# Patient Record
Sex: Female | Born: 1980 | Race: Black or African American | Hispanic: No | Marital: Single | Smoking: Current every day smoker
Health system: Southern US, Community
[De-identification: ages and names within clinical notes are randomized; demographics above are authoritative.]

## PROBLEM LIST (undated history)

## (undated) DIAGNOSIS — O2231 Deep phlebothrombosis in pregnancy, first trimester: Secondary | ICD-10-CM

## (undated) DIAGNOSIS — I2699 Other pulmonary embolism without acute cor pulmonale: Secondary | ICD-10-CM

## (undated) HISTORY — DX: Other pulmonary embolism without acute cor pulmonale: I26.99

## (undated) HISTORY — DX: Deep phlebothrombosis in pregnancy, first trimester: O22.31

---
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
## 2004-02-22 ENCOUNTER — Emergency Department (HOSPITAL_COMMUNITY): Admission: EM | Admit: 2004-02-22 | Discharge: 2004-02-22 | Payer: Self-pay | Admitting: Emergency Medicine

## 2005-01-12 ENCOUNTER — Emergency Department (HOSPITAL_COMMUNITY): Admission: EM | Admit: 2005-01-12 | Discharge: 2005-01-12 | Payer: Self-pay | Admitting: Emergency Medicine

## 2005-01-17 ENCOUNTER — Ambulatory Visit: Payer: Self-pay | Admitting: Internal Medicine

## 2005-01-30 ENCOUNTER — Emergency Department (HOSPITAL_COMMUNITY): Admission: EM | Admit: 2005-01-30 | Discharge: 2005-01-30 | Payer: Self-pay | Admitting: Emergency Medicine

## 2005-01-31 ENCOUNTER — Ambulatory Visit: Payer: Self-pay | Admitting: Family Medicine

## 2005-02-08 ENCOUNTER — Ambulatory Visit: Payer: Self-pay | Admitting: Internal Medicine

## 2005-03-31 ENCOUNTER — Encounter: Admission: RE | Admit: 2005-03-31 | Discharge: 2005-06-29 | Payer: Self-pay | Admitting: Specialist

## 2005-07-25 ENCOUNTER — Emergency Department (HOSPITAL_COMMUNITY): Admission: EM | Admit: 2005-07-25 | Discharge: 2005-07-25 | Payer: Self-pay | Admitting: Family Medicine

## 2006-12-07 IMAGING — CR DG LUMBAR SPINE COMPLETE 4+V
5 series · 5 of 5 positions shown · non-contrast
Comparison: none

CLINICAL DATA: Fell and has mid to upper lumbar pain. 
 LUMBAR SPINE - 5 VIEW:

[t l-spine a.p.]
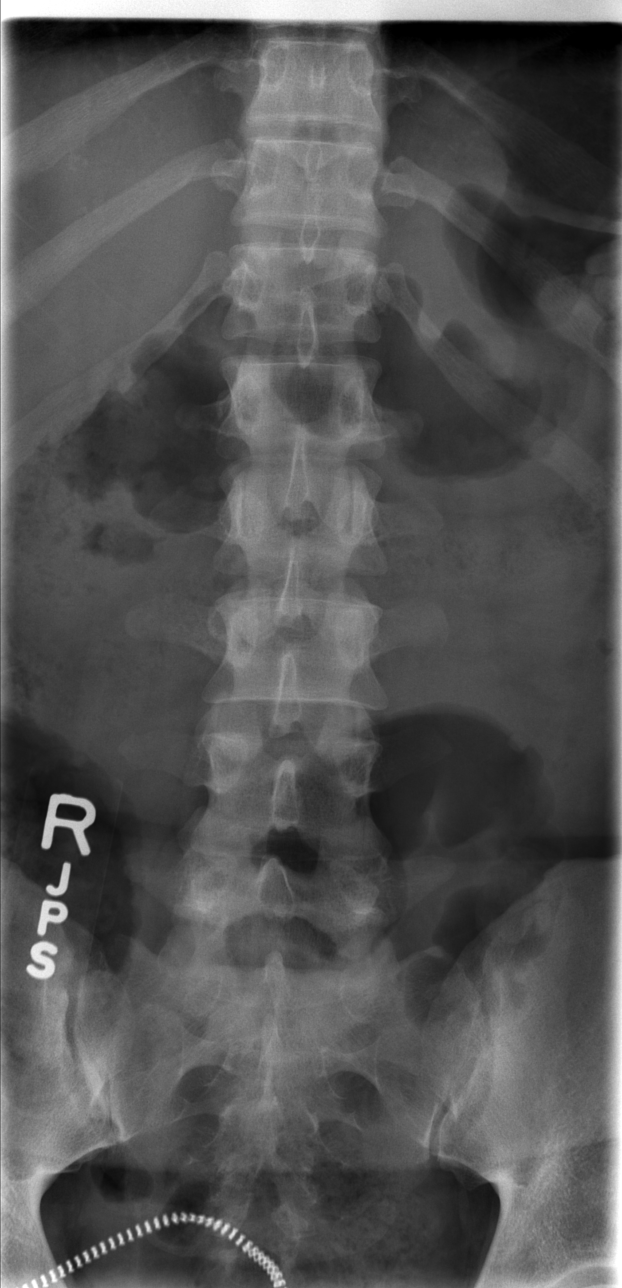

[t l-spine oblique exposure (1 of 2)]
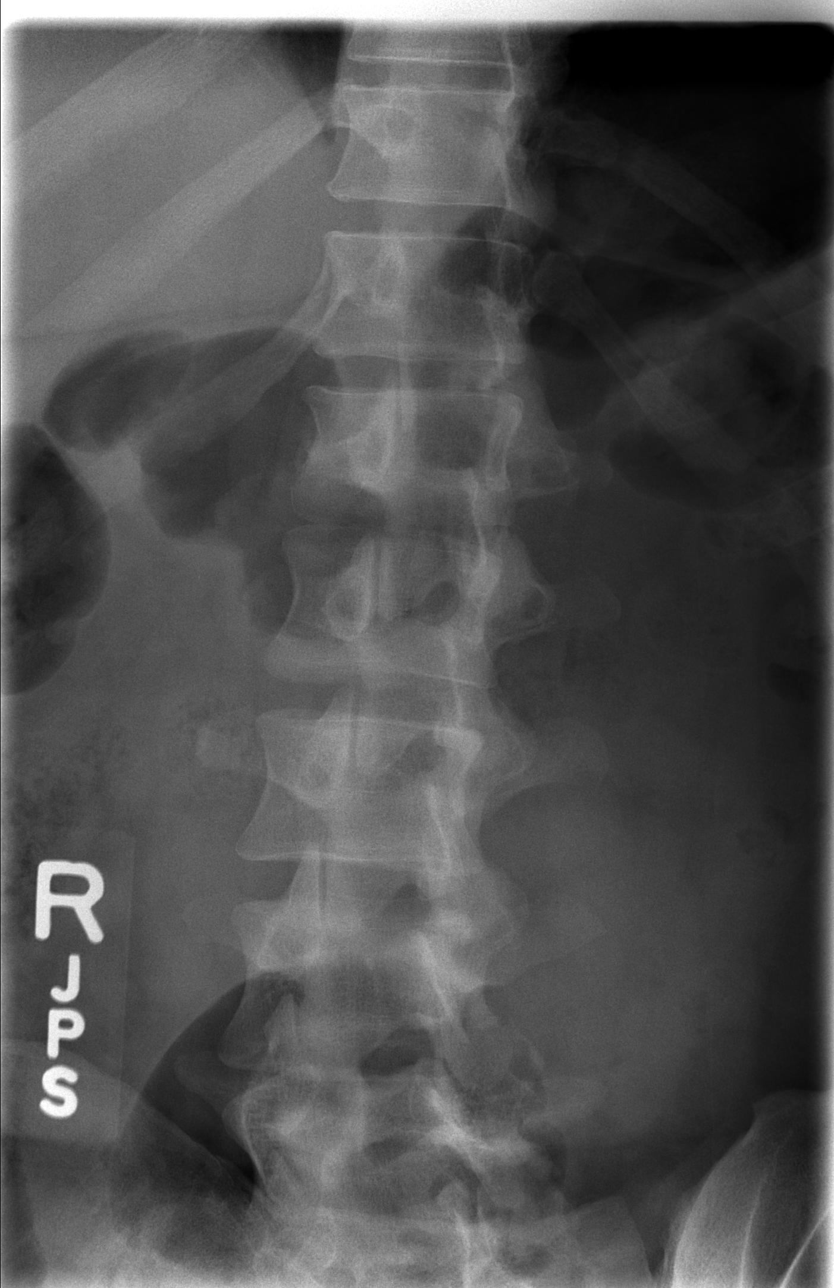

[t l-spine oblique exposure (2 of 2)]
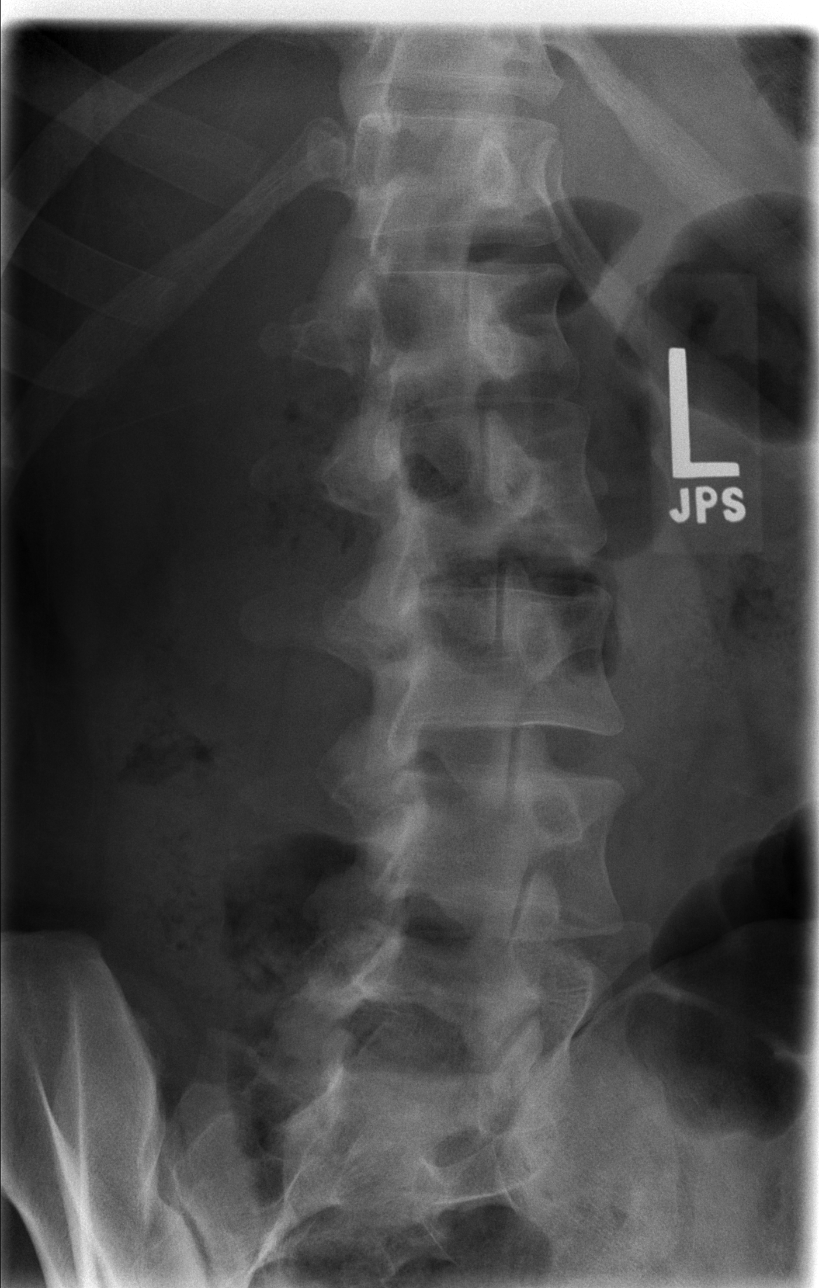

[t l-spine lat]
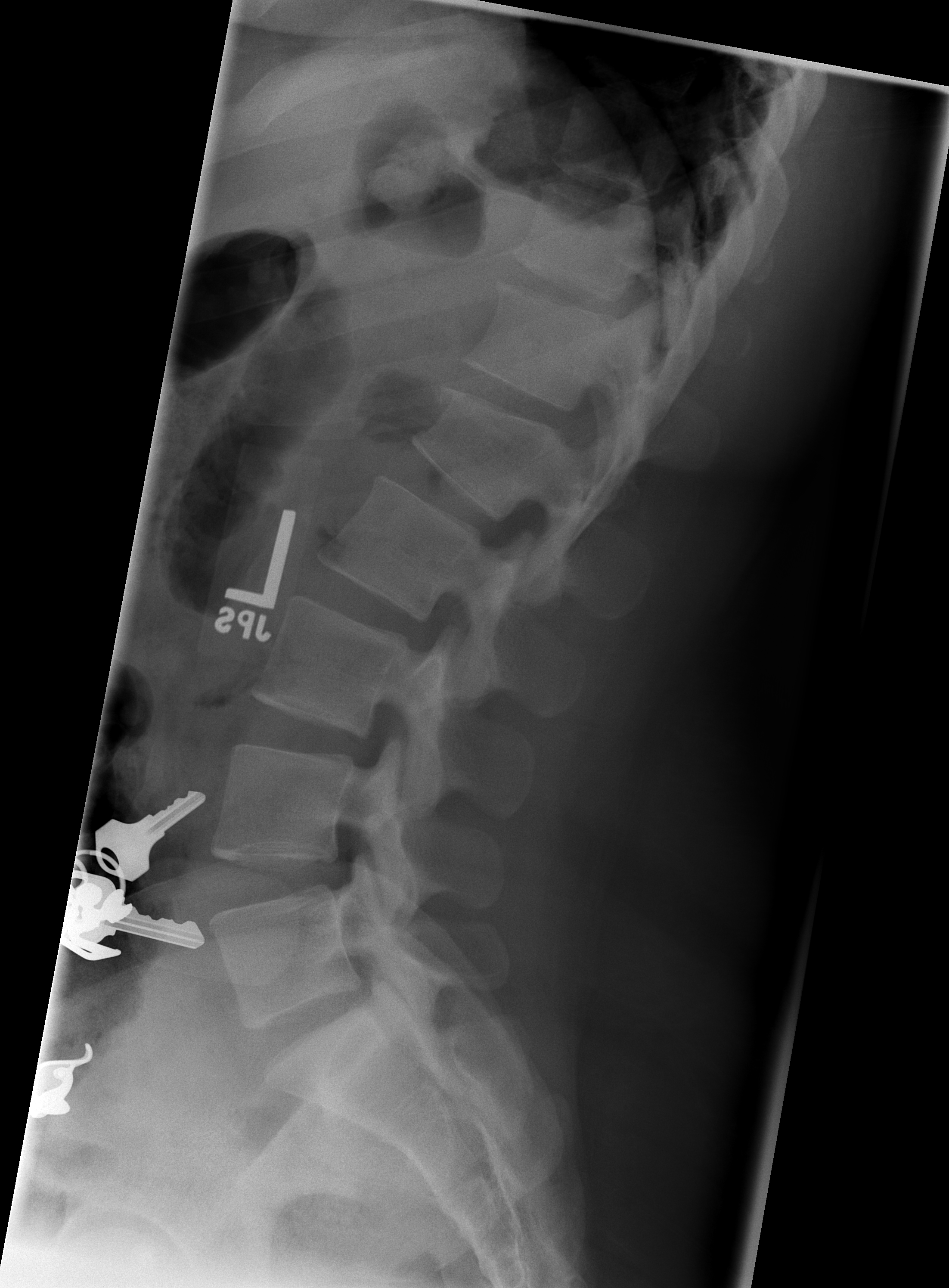

[t l-spine l5-s1 spot]
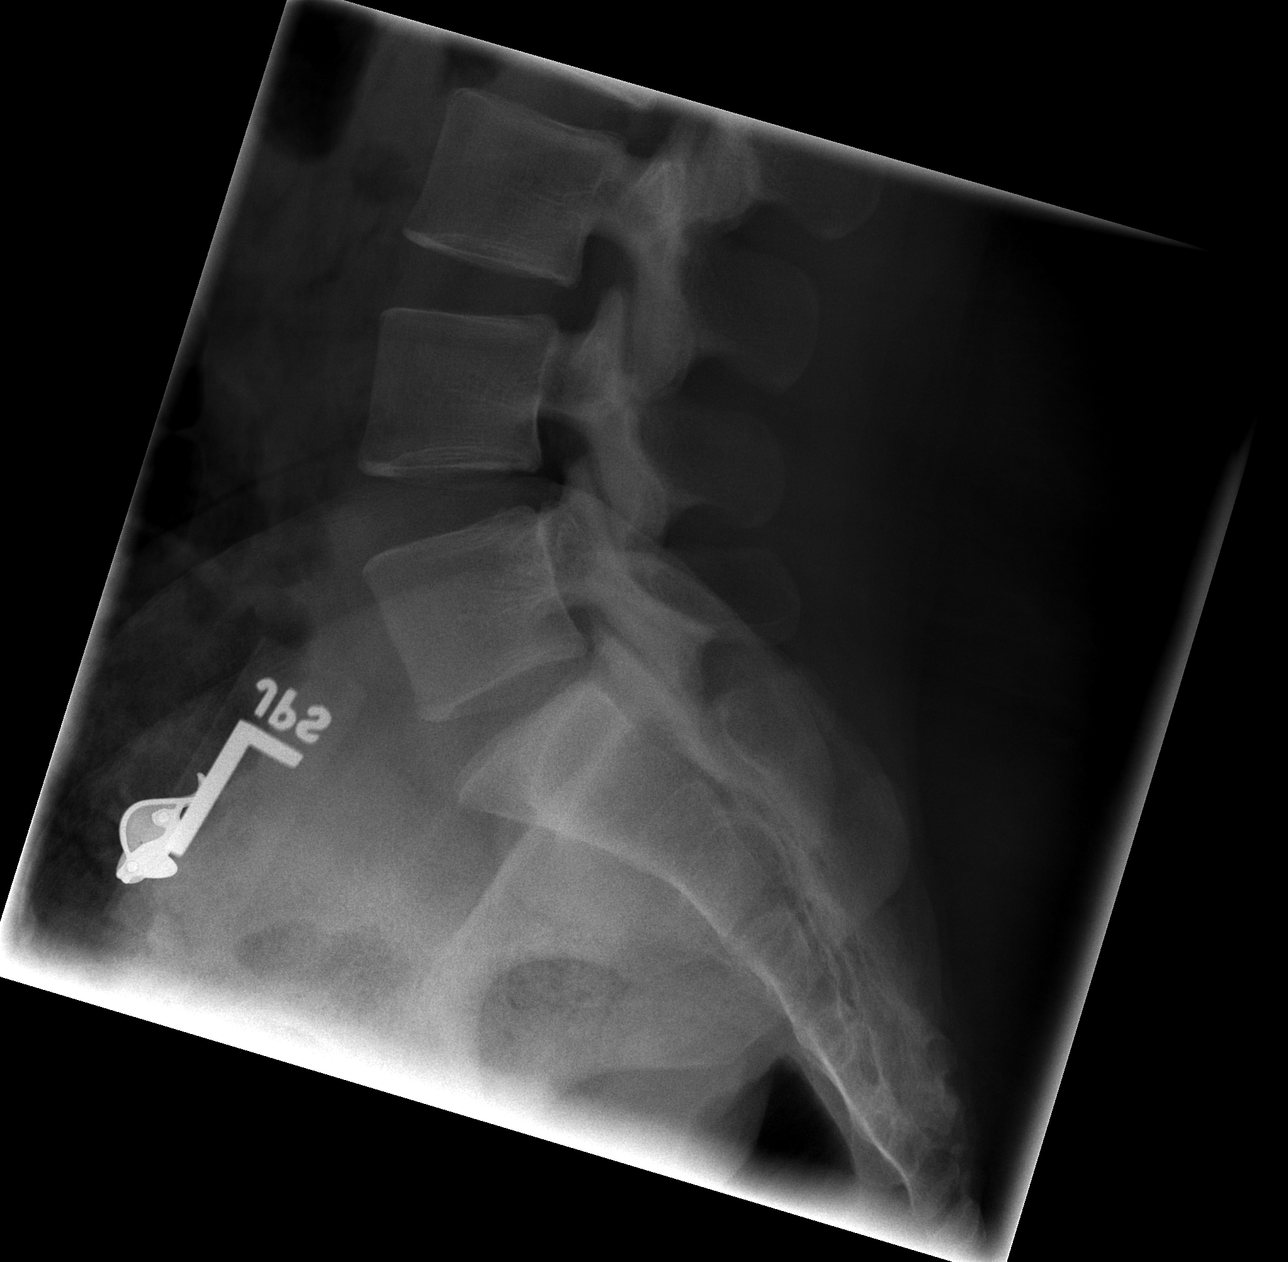

[5 of 5 positions shown; findings below may reference images not displayed]

FINDINGS: There is an acute fracture of the L1 vertebral body with anterior wedging.  Fracture appears to mainly involve the anterosuperior aspect.  Loss of height of the anterior aspect of L1 body by approximately 20%.
IMPRESSION: Acute L1 compression fracture with anterior wedging.

## 2017-08-28 ENCOUNTER — Inpatient Hospital Stay (HOSPITAL_COMMUNITY): Payer: Medicaid Other | Attending: Hematology | Admitting: Hematology

## 2017-08-28 ENCOUNTER — Encounter (HOSPITAL_COMMUNITY): Payer: Self-pay | Admitting: Hematology

## 2017-08-28 ENCOUNTER — Other Ambulatory Visit: Payer: Self-pay

## 2017-08-28 VITALS — BP 108/68 | HR 105 | Temp 97.8°F | Resp 16 | Wt 244.5 lb

## 2017-08-28 DIAGNOSIS — D6859 Other primary thrombophilia: Secondary | ICD-10-CM | POA: Insufficient documentation

## 2017-08-28 DIAGNOSIS — Z3A28 28 weeks gestation of pregnancy: Secondary | ICD-10-CM

## 2017-08-28 DIAGNOSIS — Z7901 Long term (current) use of anticoagulants: Secondary | ICD-10-CM | POA: Insufficient documentation

## 2017-08-28 DIAGNOSIS — Z86711 Personal history of pulmonary embolism: Secondary | ICD-10-CM

## 2017-08-28 DIAGNOSIS — Z800 Family history of malignant neoplasm of digestive organs: Secondary | ICD-10-CM | POA: Insufficient documentation

## 2017-08-28 DIAGNOSIS — O99333 Smoking (tobacco) complicating pregnancy, third trimester: Secondary | ICD-10-CM | POA: Diagnosis not present

## 2017-08-28 DIAGNOSIS — O88313 Pyemic and septic embolism in pregnancy, third trimester: Secondary | ICD-10-CM

## 2017-08-28 DIAGNOSIS — O2223 Superficial thrombophlebitis in pregnancy, third trimester: Secondary | ICD-10-CM | POA: Diagnosis present

## 2017-08-28 DIAGNOSIS — Z823 Family history of stroke: Secondary | ICD-10-CM | POA: Diagnosis not present

## 2017-08-28 DIAGNOSIS — Z79899 Other long term (current) drug therapy: Secondary | ICD-10-CM | POA: Insufficient documentation

## 2017-08-28 DIAGNOSIS — Z86718 Personal history of other venous thrombosis and embolism: Secondary | ICD-10-CM | POA: Insufficient documentation

## 2017-08-28 DIAGNOSIS — O99119 Other diseases of the blood and blood-forming organs and certain disorders involving the immune mechanism complicating pregnancy, unspecified trimester: Secondary | ICD-10-CM

## 2017-08-28 DIAGNOSIS — I2699 Other pulmonary embolism without acute cor pulmonale: Secondary | ICD-10-CM

## 2017-08-28 DIAGNOSIS — I82611 Acute embolism and thrombosis of superficial veins of right upper extremity: Secondary | ICD-10-CM

## 2017-08-28 DIAGNOSIS — O99113 Other diseases of the blood and blood-forming organs and certain disorders involving the immune mechanism complicating pregnancy, third trimester: Secondary | ICD-10-CM

## 2017-08-28 MED ORDER — ENOXAPARIN SODIUM 40 MG/0.4ML ~~LOC~~ SOLN: 80.0000 mg | SUBCUTANEOUS | 0 refills | Status: DC

## 2017-08-28 NOTE — Assessment & Plan Note (Addendum)
1.  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 - Family history: Mother died of thromboembolic stroke, maternal aunt with DVT x2, maternal cousin with history of thromboembolism - Superficial thrombophlebitis in the right upper extremity on 04/07/2017 at approximately 10 weeks of pregnancy(current) diagnosed in [facility] - Extensive work-up by Dr.Ribakove at [facility] with negative lupus anticoagulant, anticardiolipin antibody, anti-beta-2 glycoprotein 1 antibody, normal homocysteine, Jak 2 V617F,MPL,CALR-negative, protein C antigen 108%, protein S antigen 72%, protein S free 28%.  Flow cytometry for PNH panel was negative.  Connective tissue disorder work-up for ANA and rheumatoid factor was negative. -Patient currently a gestational age of [redacted] weeks, started on Lovenox 40 mg daily (prophylactic dose).  She had an uneventful first pregnancy during 2001.  No prior history of miscarriages.  Her EDD is 11/19/2017. - Low free protein S of 28% is predicted to for an increased risk of VTE, even though protein S levels are decreased during pregnancy. - Woman with low protein S and prior history of VTE are at increased risk of pregnancy related thrombosis and should receive antepartum and postpartum anticoagulation.   -I agree with the recommendation of anticoagulation therapy with prophylactic low molecular weight heparin for prevention of maternal VTE.  I would recommend intermediate dose anticoagulation with Lovenox starting out with 40 mg subcu once daily, increase as pregnancy progresses to 1 mg/kg dose once daily. -Currently she is at [redacted] weeks gestation.  She has gained 25 pounds since the start of pregnancy.  Hence I have recommended increasing Lovenox to 80 mg daily.  Her full dose of 1 mg/kg comes to around 110 mg daily (renal function on prior testing was normal).  I will reevaluate her in 1 month and increase  it to 1mg /kg dose after she tolerates 80 mg very well.  Antepartum thromboprophylaxis should be continued until delivery.  Postpartum thromboprophylaxis will be continued for 6 to 12 weeks.

## 2017-08-28 NOTE — Progress Notes (Signed)
[facility] CONSULT NOTE  Patient Care Team: Patient, No Pcp Per as PCP - General (General Practice)  CHIEF COMPLAINTS/PURPOSE OF CONSULTATION:  History of pulmonary embolism, low protein S, in a pregnant female.  HISTORY OF PRESENTING ILLNESS:  Beth Smith 37 y.o. female is seen in consultation today for further work-up and management of prior history of venous thromboembolism in this pregnant female.  She was referred to [place] by Drs. McLeod/Buist. She has a history of unprovoked pulmonary embolism in June 2015 diagnosed in [place], treated with more than a year of Xarelto which was subsequently discontinued.  She had a normal uneventful pregnancy in 2001.  She has become pregnant again and developed right upper extremity superficial phlebitis about 10 weeks of gestational age.  She was started on Lovenox 40 mg daily.  She is currently at [redacted] weeks gestational age.  She has gained about 25 pounds since the start of pregnancy.  She denies any bleeding issues.  No prior history of miscarriages.  Her expected date of delivery is 11/19/2017.  She is tolerating Lovenox very well.  Denies any fevers, night sweats or weight loss.  Superficial thrombophlebitis in the right forearm has improved after Lovenox.  She is half pack per day smoker but has never been on OCPs. She works at the [employer].  She had a family history of thromboembolic phenomena.  Her mother had thromboembolic stroke.  Maternal cousin had DVT.  Maternal aunt had DVT x2.  Maternal grandmother had pancreatic cancer.  MEDICAL HISTORY:  Past Medical History:  Diagnosis Date  . DVT complicating pregnancy, first trimester   . Pulmonary embolism (HCC)     SURGICAL HISTORY: History reviewed. No pertinent surgical history.  SOCIAL HISTORY: Social History   Socioeconomic History  . Marital status: Single    Spouse name: Not on file  . Number of children: 1  . Years of education: Not on file  . Highest  education level: Not on file  Occupational History  . Occupation: Nutritional therapist: [employer]  Social Needs  . Financial resource strain: Not on file  . Food insecurity:    Worry: Not on file    Inability: Not on file  . Transportation needs:    Medical: Not on file    Non-medical: Not on file  Tobacco Use  . Smoking status: Current Every Day Smoker    Packs/day: 0.25    Years: 18.00    Pack years: 4.50    Types: Cigarettes  . Smokeless tobacco: Never Used  Substance and Sexual Activity  . Alcohol use: Never    Frequency: Never  . Drug use: Never  . Sexual activity: Not on file  Lifestyle  . Physical activity:    Days per week: Not on file    Minutes per session: Not on file  . Stress: Not on file  Relationships  . Social connections:    Talks on phone: Not on file    Gets together: Not on file    Attends religious service: Not on file    Active member of club or organization: Not on file    Attends meetings of clubs or organizations: Not on file    Relationship status: Not on file  . Intimate partner violence:    Fear of current or ex partner: Not on file    Emotionally abused: Not on file    Physically abused: Not on file    Forced sexual activity:  Not on file  Other Topics Concern  . Not on file  Social History Narrative  . Not on file    FAMILY HISTORY: Family History  Problem Relation Age of Onset  . Stroke Mother   . Diabetes Maternal Grandmother     ALLERGIES:  has no allergies on file.  MEDICATIONS:  Current Outpatient Medications  Medication Sig Dispense Refill  . enoxaparin (LOVENOX) 40 MG/0.4ML injection INJECT 0.4 MLS SUBCUTANEOUSLY ONCE DAILY  3  . enoxaparin (LOVENOX) 40 MG/0.4ML injection Inject 0.8 mLs (80 mg total) into the skin daily. 30 Syringe 0  . Prenatal MV & Min w/FA-DHA ([employer] PRENATAL GUMMY PO) Take by mouth.     No current facility-administered medications for this visit.     REVIEW OF SYSTEMS:    Constitutional: Denies fevers, chills or abnormal night sweats Eyes: Denies blurriness of vision, double vision or watery eyes Ears, nose, mouth, throat, and face: Denies mucositis or sore throat Respiratory: Denies cough, dyspnea or wheezes Cardiovascular: Denies palpitation, chest discomfort or lower extremity swelling Gastrointestinal:  Denies nausea, heartburn or change in bowel habits Skin: Denies abnormal skin rashes Lymphatics: Denies new lymphadenopathy or easy bruising Neurological:Denies numbness, tingling or new weaknesses Behavioral/Psych: Mood is stable, no new changes  All other systems were reviewed with the patient and are negative.  PHYSICAL EXAMINATION: ECOG PERFORMANCE STATUS: 0 - Asymptomatic  Vitals:   08/28/17 1348  BP: 108/68  Pulse: (!) 105  Resp: 16  Temp: 97.8 F (36.6 C)  SpO2: 100%   Filed Weights   08/28/17 1348  Weight: 244 lb 8 oz (110.9 kg)    GENERAL:alert, no distress and comfortable SKIN: skin color, texture, turgor are normal, no rashes or significant lesions EYES: normal, conjunctiva are pink and non-injected, sclera clear OROPHARYNX:no exudate, no erythema and lips, buccal mucosa, and tongue normal  NECK: supple, thyroid normal size, non-tender, without nodularity LYMPH:  no palpable lymphadenopathy in the cervical, axillary or inguinal LUNGS: clear to auscultation and percussion with normal breathing effort HEART: regular rate & rhythm and no murmurs and no lower extremity edema ABDOMEN:abdomen soft, non-tender.  Musculoskeletal:no cyanosis of digits and no clubbing  PSYCH: alert & oriented x 3 with fluent speech NEURO: no focal motor/sensory deficits  LABORATORY DATA:  I have reviewed the data as listed No results found for: WBC, HGB, HCT, MCV, PLT   Chemistry   No results found for: NA, K, CL, CO2, BUN, CREATININE, GLU No results found for: CALCIUM, ALKPHOS, AST, ALT, BILITOT      ASSESSMENT & PLAN:  Protein S deficiency  complicating pregnancy (HCC) 1.  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 - Family history: Mother died of thromboembolic stroke, maternal aunt with DVT x2, maternal cousin with history of thromboembolism - Superficial thrombophlebitis in the right upper extremity on 04/07/2017 at approximately 10 weeks of pregnancy(current) diagnosed in [facility] - Extensive work-up by Dr.Ribakove at [facility] with negative lupus anticoagulant, anticardiolipin antibody, anti-beta-2 glycoprotein 1 antibody, normal homocysteine, Jak 2 V617F,MPL,CALR-negative, protein C antigen 108%, protein S antigen 72%, protein S free 28%.  Flow cytometry for PNH panel was negative.  Connective tissue disorder work-up for ANA and rheumatoid factor was negative. -Patient currently a gestational age of [redacted] weeks, started on Lovenox 40 mg daily (prophylactic dose).  She had an uneventful first pregnancy during 2001.  No prior history of miscarriages.  Her EDD is 11/19/2017. - Low  free protein S of 28% is predicted to for an increased risk of VTE, even though protein S levels are decreased during pregnancy. - Woman with low protein S and prior history of VTE are at increased risk of pregnancy related thrombosis and should receive antepartum and postpartum anticoagulation.   -I agree with the recommendation of anticoagulation therapy with prophylactic low molecular weight heparin for prevention of maternal VTE.  I would recommend intermediate dose anticoagulation with Lovenox starting out with 40 mg subcu once daily, increase as pregnancy progresses to 1 mg/kg dose once daily. -Currently she is at [redacted] weeks gestation.  She has gained 25 pounds since the start of pregnancy.  Hence I have recommended increasing Lovenox to 80 mg daily.  Her full dose of 1 mg/kg comes to around 110 mg daily (renal function on prior testing was normal).  I will reevaluate  her in 1 month and increase it to 1mg /kg dose after she tolerates 80 mg very well.  Antepartum thromboprophylaxis should be continued until delivery.  Postpartum thromboprophylaxis will be continued for 6 to 12 weeks.  No orders of the defined types were placed in this encounter.   All questions were answered. The patient knows to call the clinic with any problems, questions or concerns. Total time spent is 60 minutes with more than 50% of the time spent face-to-face discussing diagnosis, management recommendations and coordination of care.     Doreatha MassedSreedhar Shamecka Hocutt, MD 08/28/2017 9:48 PM

## 2017-08-29 ENCOUNTER — Other Ambulatory Visit (HOSPITAL_COMMUNITY): Payer: Self-pay | Admitting: *Deleted

## 2017-08-29 MED ORDER — ENOXAPARIN SODIUM 80 MG/0.8ML ~~LOC~~ SOLN: 80.0000 mg | SUBCUTANEOUS | 2 refills | Status: DC

## 2017-09-24 ENCOUNTER — Encounter (HOSPITAL_COMMUNITY): Payer: Self-pay | Admitting: Hematology

## 2017-09-24 ENCOUNTER — Inpatient Hospital Stay (HOSPITAL_COMMUNITY): Payer: Medicaid Other | Attending: Hematology | Admitting: Hematology

## 2017-09-24 VITALS — BP 120/70 | HR 101 | Temp 98.3°F | Resp 16 | Wt 258.0 lb

## 2017-09-24 DIAGNOSIS — R5383 Other fatigue: Secondary | ICD-10-CM | POA: Insufficient documentation

## 2017-09-24 DIAGNOSIS — O88313 Pyemic and septic embolism in pregnancy, third trimester: Secondary | ICD-10-CM

## 2017-09-24 DIAGNOSIS — Z86711 Personal history of pulmonary embolism: Secondary | ICD-10-CM | POA: Diagnosis not present

## 2017-09-24 DIAGNOSIS — D6859 Other primary thrombophilia: Secondary | ICD-10-CM

## 2017-09-24 DIAGNOSIS — Z7901 Long term (current) use of anticoagulants: Secondary | ICD-10-CM

## 2017-09-24 DIAGNOSIS — O99113 Other diseases of the blood and blood-forming organs and certain disorders involving the immune mechanism complicating pregnancy, third trimester: Secondary | ICD-10-CM | POA: Diagnosis not present

## 2017-09-24 DIAGNOSIS — O2223 Superficial thrombophlebitis in pregnancy, third trimester: Secondary | ICD-10-CM | POA: Diagnosis not present

## 2017-09-24 DIAGNOSIS — Z79899 Other long term (current) drug therapy: Secondary | ICD-10-CM | POA: Insufficient documentation

## 2017-09-24 DIAGNOSIS — O99119 Other diseases of the blood and blood-forming organs and certain disorders involving the immune mechanism complicating pregnancy, unspecified trimester: Secondary | ICD-10-CM

## 2017-09-24 DIAGNOSIS — F17210 Nicotine dependence, cigarettes, uncomplicated: Secondary | ICD-10-CM | POA: Diagnosis not present

## 2017-09-24 MED ORDER — ENOXAPARIN SODIUM 100 MG/ML ~~LOC~~ SOLN: 100.0000 mg | SUBCUTANEOUS | 2 refills | Status: AC

## 2017-09-24 NOTE — Progress Notes (Signed)
[address]eth [address], [place] [address]   CLINIC:  Medical Oncology/Hematology  PCP:  Patient, No Pcp Per No address on file None   REASON FOR VISIT:  Follow-up for history of pulmonary embolism and low protein S in a pregnant women  CURRENT THERAPY: Lovenox 100 mg daily home injections   INTERVAL HISTORY:  Beth Smith 37 y.o. female returns for routine follow-up for history of pulmonary embolism with low protein S. Patient is here today with her boyfriend. Patient is taking the 80 mg Lovenox injections at this time. Patient has gained 4 pounds and we will dose adjust the Lovenox. Patient is doing well with the daily home injections. Patient is experiencing more fatigue throughout her pregnancy. Patient denies any SOB. Denies any new pains in chest or legs. Patient reports her appetite is 100% and she is gaining weight as expected. Her energy level is 75%.     REVIEW OF SYSTEMS:  Review of Systems  Constitutional: Positive for fatigue.  All other systems reviewed and are negative.    PAST MEDICAL/SURGICAL HISTORY:  Past Medical History:  Diagnosis Date  . DVT complicating pregnancy, first trimester   . Pulmonary embolism (HCC)    History reviewed. No pertinent surgical history.   SOCIAL HISTORY:  Social History   Socioeconomic History  . Marital status: Single    Spouse name: Not on file  . Number of children: 1  . Years of education: Not on file  . Highest education level: Not on file  Occupational History  . Occupation: Nutritional therapist: [employer]  Social Needs  . Financial resource strain: Not on file  . Food insecurity:    Worry: Not on file    Inability: Not on file  . Transportation needs:    Medical: Not on file    Non-medical: Not on file  Tobacco Use  . Smoking status: Current Every Day Smoker    Packs/day: 0.25    Years: 18.00    Pack years: 4.50    Types: Cigarettes  . Smokeless tobacco: Never Used    Substance and Sexual Activity  . Alcohol use: Never    Frequency: Never  . Drug use: Never  . Sexual activity: Not on file  Lifestyle  . Physical activity:    Days per week: Not on file    Minutes per session: Not on file  . Stress: Not on file  Relationships  . Social connections:    Talks on phone: Not on file    Gets together: Not on file    Attends religious service: Not on file    Active member of club or organization: Not on file    Attends meetings of clubs or organizations: Not on file    Relationship status: Not on file  . Intimate partner violence:    Fear of current or ex partner: Not on file    Emotionally abused: Not on file    Physically abused: Not on file    Forced sexual activity: Not on file  Other Topics Concern  . Not on file  Social History Narrative  . Not on file    FAMILY HISTORY:  Family History  Problem Relation Age of Onset  . Stroke Mother   . Diabetes Maternal Grandmother     CURRENT MEDICATIONS:  Outpatient Encounter Medications as of 09/24/2017  Medication Sig  . enoxaparin (LOVENOX) 80 MG/0.8ML injection Inject 0.8 mLs (80 mg total) into  the skin daily.  . Prenatal MV & Min w/FA-DHA ([employer] PRENATAL GUMMY PO) Take by mouth.  . enoxaparin (LOVENOX) 100 MG/ML injection Inject 1 mL (100 mg total) into the skin daily.   No facility-administered encounter medications on file as of 09/24/2017.     ALLERGIES:  Not on File   PHYSICAL EXAM:  ECOG Performance status: 1  Vitals:   09/24/17 0811  BP: 120/70  Pulse: (!) 101  Resp: 16  Temp: 98.3 F (36.8 C)  SpO2: 100%   Filed Weights   09/24/17 0811  Weight: 258 lb (117 kg)    Physical Exam  Constitutional: She is oriented to person, place, and time. She appears well-developed and well-nourished.  Cardiovascular: Normal rate, regular rhythm and normal heart sounds.  Pulmonary/Chest: Effort normal and breath sounds normal.  Neurological: She is alert and oriented to person, place,  and time.  Skin: Skin is warm and dry.     LABORATORY DATA:  I have reviewed the labs as listed.  CBC No results found for: WBC, RBC, HGB, HCT, PLT, MCV, MCH, MCHC, RDW, LYMPHSABS, MONOABS, EOSABS, BASOSABS No flowsheet data found.      ASSESSMENT & PLAN:   Protein S deficiency complicating pregnancy (HCC) 1.  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 - Family history: Mother died of thromboembolic stroke, maternal aunt with DVT x2, maternal cousin with history of thromboembolism - Superficial thrombophlebitis in the right upper extremity on 04/07/2017 at approximately 10 weeks of pregnancy(current) [facility] - Extensive work-up by Dr.Ribakove at [facility] with negative lupus anticoagulant, anticardiolipin antibody, anti-beta-2 glycoprotein 1 antibody, normal homocysteine, Jak 2 V617F,MPL,CALR-negative, protein C antigen 108%, protein S antigen 72%, protein S free 28%.  Flow cytometry for PNH panel was negative.  Connective tissue disorder work-up for ANA and rheumatoid factor was negative. -Patient currently a gestational age of [redacted] weeks, started on Lovenox 40 mg daily (prophylactic dose).  She had an uneventful first pregnancy during 2001.  No prior history of miscarriages.  Her EDD is 11/19/2017. - Low free protein S of 28% is predicted to for an increased risk of VTE, even though protein S levels are decreased during pregnancy. - Woman with low protein S and prior history of VTE are at increased risk of pregnancy related thrombosis and should receive antepartum and postpartum anticoagulation.   -I agree with the recommendation of anticoagulation therapy with prophylactic low molecular weight heparin for prevention of maternal VTE.  I would recommend intermediate dose anticoagulation with Lovenox starting out with 40 mg subcu once daily, increase as pregnancy progresses to 1 mg/kg  dose once daily. - At last visit we have increased his Lovenox to 80 mg daily.  She is tolerating it very well.  She gained another 4 pounds from last visit.  We will increase her Lovenox to 100 mg daily.  Antepartum thromboprophylaxis should be continued until delivery.  Postpartum thromboprophylaxis will be continued for 6 to 12 weeks. -I will see her back in the first week of October.      Orders placed this encounter:  Orders Placed This Encounter  Procedures  . APTT  . Protime-INR  . CBC with Differential/Platelet  . Comprehensive metabolic panel      Doreatha MassedSreedhar Katragadda, MD [facility] [phone]

## 2017-09-24 NOTE — Patient Instructions (Addendum)
[facility] at [facility]  Discharge Instructions: Please return for a follow up in the first week of October with labs the same day.  You were seen by Dr. Kirtland BouchardK today.  _______________________________________________________________  Thank you for choosing [facility] at [facility] to provide your oncology and hematology care.  To afford each patient quality time with our providers, please arrive at least 15 minutes before your scheduled appointment.  You need to re-schedule your appointment if you arrive 10 or more minutes late.  We strive to give you quality time with our providers, and arriving late affects you and other patients whose appointments are after yours.  Also, if you no show three or more times for appointments you may be dismissed from the clinic.  Again, thank you for choosing [facility] at [facility]. Our hope is that these requests will allow you access to exceptional care and in a timely manner. _______________________________________________________________  If you have questions after your visit, please contact our office at [phone] between the hours of 8:30 a.m. and 5:00 p.m. Voicemails left after 4:30 p.m. will not be returned until the following business day. _______________________________________________________________  For prescription refill requests, have your pharmacy contact our office. _______________________________________________________________  Recommendations made by the consultant and any test results will be sent to your referring physician. _______________________________________________________________

## 2017-09-24 NOTE — Assessment & Plan Note (Addendum)
1.  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 - Family history: Mother died of thromboembolic stroke, maternal aunt with DVT x2, maternal cousin with history of thromboembolism - Superficial thrombophlebitis in the right upper extremity on 04/07/2017 at approximately 10 weeks of pregnancy(current) [facility] - Extensive work-up by Dr.Ribakove at [facility] with negative lupus anticoagulant, anticardiolipin antibody, anti-beta-2 glycoprotein 1 antibody, normal homocysteine, Jak 2 V617F,MPL,CALR-negative, protein C antigen 108%, protein S antigen 72%, protein S free 28%.  Flow cytometry for PNH panel was negative.  Connective tissue disorder work-up for ANA and rheumatoid factor was negative. -Patient currently a gestational age of [redacted] weeks, started on Lovenox 40 mg daily (prophylactic dose).  She had an uneventful first pregnancy during 2001.  No prior history of miscarriages.  Her EDD is 11/19/2017. - Low free protein S of 28% is predicted to for an increased risk of VTE, even though protein S levels are decreased during pregnancy. - Woman with low protein S and prior history of VTE are at increased risk of pregnancy related thrombosis and should receive antepartum and postpartum anticoagulation.   -I agree with the recommendation of anticoagulation therapy with prophylactic low molecular weight heparin for prevention of maternal VTE.  I would recommend intermediate dose anticoagulation with Lovenox starting out with 40 mg subcu once daily, increase as pregnancy progresses to 1 mg/kg dose once daily. - At last visit we have increased his Lovenox to 80 mg daily.  She is tolerating it very well.  She gained another 4 pounds from last visit.  We will increase her Lovenox to 100 mg daily.  Antepartum thromboprophylaxis should be continued until delivery.  Postpartum thromboprophylaxis will be continued for  6 to 12 weeks. -I will see her back in the first week of October.

## 2017-11-01 ENCOUNTER — Inpatient Hospital Stay (HOSPITAL_COMMUNITY): Payer: Medicaid Other | Attending: Hematology | Admitting: Hematology

## 2017-11-01 ENCOUNTER — Inpatient Hospital Stay (HOSPITAL_COMMUNITY): Payer: Medicaid Other

## 2017-11-01 ENCOUNTER — Encounter (HOSPITAL_COMMUNITY): Payer: Self-pay | Admitting: Hematology

## 2017-11-01 ENCOUNTER — Other Ambulatory Visit: Payer: Self-pay

## 2017-11-01 DIAGNOSIS — Z86711 Personal history of pulmonary embolism: Secondary | ICD-10-CM | POA: Insufficient documentation

## 2017-11-01 DIAGNOSIS — Z823 Family history of stroke: Secondary | ICD-10-CM | POA: Diagnosis not present

## 2017-11-01 DIAGNOSIS — O99119 Other diseases of the blood and blood-forming organs and certain disorders involving the immune mechanism complicating pregnancy, unspecified trimester: Secondary | ICD-10-CM

## 2017-11-01 DIAGNOSIS — Z86718 Personal history of other venous thrombosis and embolism: Secondary | ICD-10-CM

## 2017-11-01 DIAGNOSIS — O99113 Other diseases of the blood and blood-forming organs and certain disorders involving the immune mechanism complicating pregnancy, third trimester: Secondary | ICD-10-CM | POA: Diagnosis not present

## 2017-11-01 DIAGNOSIS — D6859 Other primary thrombophilia: Secondary | ICD-10-CM | POA: Diagnosis present

## 2017-11-01 DIAGNOSIS — Z7901 Long term (current) use of anticoagulants: Secondary | ICD-10-CM | POA: Insufficient documentation

## 2017-11-01 DIAGNOSIS — F17210 Nicotine dependence, cigarettes, uncomplicated: Secondary | ICD-10-CM | POA: Diagnosis not present

## 2017-11-01 DIAGNOSIS — R5383 Other fatigue: Secondary | ICD-10-CM | POA: Insufficient documentation

## 2017-11-01 DIAGNOSIS — R200 Anesthesia of skin: Secondary | ICD-10-CM | POA: Diagnosis not present

## 2017-11-01 DIAGNOSIS — O88313 Pyemic and septic embolism in pregnancy, third trimester: Secondary | ICD-10-CM

## 2017-11-01 LAB — CBC WITH DIFFERENTIAL/PLATELET
BASOS ABS: 0 10*3/uL (ref 0.0–0.1)
BASOS PCT: 0 %
Eosinophils Absolute: 0.2 10*3/uL (ref 0.0–0.7)
Eosinophils Relative: 2 %
HEMATOCRIT: 32.8 % — AB (ref 36.0–46.0)
HEMOGLOBIN: 10.7 g/dL — AB (ref 12.0–15.0)
Lymphocytes Relative: 12 %
Lymphs Abs: 1.2 10*3/uL (ref 0.7–4.0)
MCH: 28.1 pg (ref 26.0–34.0)
MCHC: 32.6 g/dL (ref 30.0–36.0)
MCV: 86.1 fL (ref 78.0–100.0)
Monocytes Absolute: 0.8 10*3/uL (ref 0.1–1.0)
Monocytes Relative: 8 %
NEUTROS ABS: 8.4 10*3/uL — AB (ref 1.7–7.7)
NEUTROS PCT: 78 %
Platelets: 216 10*3/uL (ref 150–400)
RBC: 3.81 MIL/uL — ABNORMAL LOW (ref 3.87–5.11)
RDW: 13.7 % (ref 11.5–15.5)
WBC: 10.6 10*3/uL — ABNORMAL HIGH (ref 4.0–10.5)

## 2017-11-01 LAB — COMPREHENSIVE METABOLIC PANEL
ALBUMIN: 2.9 g/dL — AB (ref 3.5–5.0)
ALK PHOS: 75 U/L (ref 38–126)
ALT: 14 U/L (ref 0–44)
AST: 14 U/L — ABNORMAL LOW (ref 15–41)
Anion gap: 7 (ref 5–15)
BILIRUBIN TOTAL: 0.4 mg/dL (ref 0.3–1.2)
BUN: 6 mg/dL (ref 6–20)
CO2: 22 mmol/L (ref 22–32)
Calcium: 8.8 mg/dL — ABNORMAL LOW (ref 8.9–10.3)
Chloride: 106 mmol/L (ref 98–111)
Creatinine, Ser: 0.64 mg/dL (ref 0.44–1.00)
GFR calc Af Amer: >60 mL/min (ref >60)
GFR calc non Af Amer: >60 mL/min (ref >60)
GLUCOSE: 93 mg/dL (ref 70–99)
POTASSIUM: 3.9 mmol/L (ref 3.5–5.1)
Sodium: 135 mmol/L (ref 135–145)
TOTAL PROTEIN: 7.2 g/dL (ref 6.5–8.1)

## 2017-11-01 LAB — APTT: aPTT: 27 seconds (ref 24–36)

## 2017-11-01 LAB — PROTIME-INR
INR: 1.01
PROTHROMBIN TIME: 13.2 s (ref 11.4–15.2)

## 2017-11-01 NOTE — Progress Notes (Signed)
[facility] [address], [place] [address]   CLINIC:  Medical Oncology/Hematology  PCP:  Patient, No Pcp Per No address on file None   REASON FOR VISIT:  Follow-up for a history of pulmonary embolism and low protein S in a pregnant women  CURRENT THERAPY: Starting Heparin and stopping lovenox injections due to due date   INTERVAL HISTORY:  Beth Smith 37 y.o. female returns for routine follow-up for history of pulmonary embolism and low protein S in pregnancy. She is supposed to start the heparin injections BID this week she is just waiting on her syringes. She reports her appetite as 100% and her energy level is 50%. She denies any easy bruising or bleeding from anywhere.     REVIEW OF SYSTEMS:  Review of Systems  Constitutional: Positive for fatigue.  Neurological: Positive for numbness.  All other systems reviewed and are negative.    PAST MEDICAL/SURGICAL HISTORY:  Past Medical History:  Diagnosis Date  . DVT complicating pregnancy, first trimester   . Pulmonary embolism (HCC)    History reviewed. No pertinent surgical history.   SOCIAL HISTORY:  Social History   Socioeconomic History  . Marital status: Single    Spouse name: Not on file  . Number of children: 1  . Years of education: Not on file  . Highest education level: Not on file  Occupational History  . Occupation: Nutritional therapist: [employer]  Social Needs  . Financial resource strain: Not on file  . Food insecurity:    Worry: Not on file    Inability: Not on file  . Transportation needs:    Medical: Not on file    Non-medical: Not on file  Tobacco Use  . Smoking status: Current Every Day Smoker    Packs/day: 0.25    Years: 18.00    Pack years: 4.50    Types: Cigarettes  . Smokeless tobacco: Never Used  Substance and Sexual Activity  . Alcohol use: Never    Frequency: Never  . Drug use: Never  . Sexual activity: Not on file  Lifestyle  . Physical  activity:    Days per week: Not on file    Minutes per session: Not on file  . Stress: Not on file  Relationships  . Social connections:    Talks on phone: Not on file    Gets together: Not on file    Attends religious service: Not on file    Active member of club or organization: Not on file    Attends meetings of clubs or organizations: Not on file    Relationship status: Not on file  . Intimate partner violence:    Fear of current or ex partner: Not on file    Emotionally abused: Not on file    Physically abused: Not on file    Forced sexual activity: Not on file  Other Topics Concern  . Not on file  Social History Narrative  . Not on file    FAMILY HISTORY:  Family History  Problem Relation Age of Onset  . Stroke Mother   . Diabetes Maternal Grandmother     CURRENT MEDICATIONS:  Outpatient Encounter Medications as of 11/01/2017  Medication Sig  . enoxaparin (LOVENOX) 100 MG/ML injection Inject 1 mL (100 mg total) into the skin daily.  . Prenatal MV & Min w/FA-DHA ([employer] PRENATAL GUMMY PO) Take by mouth.  . heparin 60454 UNIT/ML injection INJECT 1 ML (CC)  EVERY 12 HOURS  . [DISCONTINUED] enoxaparin (LOVENOX) 80 MG/0.8ML injection Inject 0.8 mLs (80 mg total) into the skin daily.   No facility-administered encounter medications on file as of 11/01/2017.     ALLERGIES:  Not on File   PHYSICAL EXAM:  ECOG Performance status: 1  Vitals:   11/01/17 1120  BP: 119/79  Pulse: (!) 110  Resp: 18  Temp: 98.3 F (36.8 C)  SpO2: 99%   Filed Weights   11/01/17 1120  Weight: 251 lb 6.4 oz (114 kg)    Physical Exam  Constitutional: She is oriented to person, place, and time. She appears well-developed and well-nourished.  Musculoskeletal: Normal range of motion.  Neurological: She is alert and oriented to person, place, and time.  Skin: Skin is warm and dry.  Psychiatric: She has a normal mood and affect. Her behavior is normal. Judgment and thought content normal.       LABORATORY DATA:  I have reviewed the labs as listed.  CBC    Component Value Date/Time   WBC 10.6 (H) 11/01/2017 1034   RBC 3.81 (L) 11/01/2017 1034   HGB 10.7 (L) 11/01/2017 1034   HCT 32.8 (L) 11/01/2017 1034   PLT 216 11/01/2017 1034   MCV 86.1 11/01/2017 1034   MCH 28.1 11/01/2017 1034   MCHC 32.6 11/01/2017 1034   RDW 13.7 11/01/2017 1034   LYMPHSABS 1.2 11/01/2017 1034   MONOABS 0.8 11/01/2017 1034   EOSABS 0.2 11/01/2017 1034   BASOSABS 0.0 11/01/2017 1034   CMP Latest Ref Rng & Units 11/01/2017  Glucose 70 - 99 mg/dL 93  BUN 6 - 20 mg/dL 6  Creatinine 1.61 - 0.96 mg/dL 0.45  Sodium 409 - 811 mmol/L 135  Potassium 3.5 - 5.1 mmol/L 3.9  Chloride 98 - 111 mmol/L 106  CO2 22 - 32 mmol/L 22  Calcium 8.9 - 10.3 mg/dL 9.1(Y)  Total Protein 6.5 - 8.1 g/dL 7.2  Total Bilirubin 0.3 - 1.2 mg/dL 0.4  Alkaline Phos 38 - 126 U/L 75  AST 15 - 41 U/L 14(L)  ALT 0 - 44 U/L 14           ASSESSMENT & PLAN:   Protein S deficiency complicating pregnancy (HCC) 1.  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 - Family history: Mother died of thromboembolic stroke, maternal aunt with DVT x2, maternal cousin with history of thromboembolism - Superficial thrombophlebitis in the right upper extremity on 04/07/2017 at approximately 10 weeks of pregnancy(current) diagnosed in [facility] - Extensive work-up by Dr.Ribakove at [facility] with negative lupus anticoagulant, anticardiolipin antibody, anti-beta-2 glycoprotein 1 antibody, normal homocysteine, Jak 2 V617F,MPL,CALR-negative, protein C antigen 108%, protein S antigen 72%, protein S free 28%.  Flow cytometry for PNH panel was negative.  Connective tissue disorder work-up for ANA and rheumatoid factor was negative. -Patient currently a gestational age of [redacted] weeks, started on Lovenox 40 mg daily (prophylactic dose).  She had an uneventful  first pregnancy during 2001.  No prior history of miscarriages.  Her EDD is 11/19/2017. - Low free protein S of 28% is predicted to for an increased risk of VTE, even though protein S levels are decreased during pregnancy. - Woman with low protein S and prior history of VTE are at increased risk of pregnancy related thrombosis and should receive antepartum and postpartum anticoagulation.   -I agree with the recommendation of anticoagulation therapy with prophylactic low  molecular weight heparin for prevention of maternal VTE.  I would recommend intermediate dose anticoagulation with Lovenox starting out with 40 mg subcu once daily, increase as pregnancy progresses to 1 mg/kg dose once daily. -Lovenox was increased to 100 mg daily on 09/24/2017.  She is tolerating it very well.  No bleeding was reported. -Dr. Mora Appl has given a prescription for heparin to be taken twice daily.  She has not filled the prescription yet.  Expected date of delivery is 11/19/2017. -She was told to start back on Lovenox 40 mg daily postpartum.  We will see her back in 4 weeks for follow-up.      Orders placed this encounter:  No orders of the defined types were placed in this encounter.     Doreatha Massed, MD [facility] [phone]

## 2017-11-01 NOTE — Patient Instructions (Signed)
[facility] at [facility] Discharge Instructions  Follow up in 4 weeks    Thank you for choosing [facility] at [facility] to provide your oncology and hematology care.  To afford each patient quality time with our provider, please arrive at least 15 minutes before your scheduled appointment time.   If you have a lab appointment with the Cancer Center please come in thru the  Main Entrance and check in at the main information desk  You need to re-schedule your appointment should you arrive 10 or more minutes late.  We strive to give you quality time with our providers, and arriving late affects you and other patients whose appointments are after yours.  Also, if you no show three or more times for appointments you may be dismissed from the clinic at the providers discretion.     Again, thank you for choosing [facility].  Our hope is that these requests will decrease the amount of time that you wait before being seen by our physicians.       _____________________________________________________________  Should you have questions after your visit to [facility], please contact our office at [phone] between the hours of 8:00 a.m. and 4:30 p.m.  Voicemails left after 4:00 p.m. will not be returned until the following business day.  For prescription refill requests, have your pharmacy contact our office and allow 72 hours.    Cancer Center Support Programs:   > Cancer Support Group  2nd Tuesday of the month 1pm-2pm, Journey Room    

## 2017-11-01 NOTE — Assessment & Plan Note (Signed)
1.  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 - Family history: Mother died of thromboembolic stroke, maternal aunt with DVT x2, maternal cousin with history of thromboembolism - Superficial thrombophlebitis in the right upper extremity on 04/07/2017 at approximately 10 weeks of pregnancy(current) diagnosed in [facility] - Extensive work-up by Dr.Ribakove at [facility] with negative lupus anticoagulant, anticardiolipin antibody, anti-beta-2 glycoprotein 1 antibody, normal homocysteine, Jak 2 V617F,MPL,CALR-negative, protein C antigen 108%, protein S antigen 72%, protein S free 28%.  Flow cytometry for PNH panel was negative.  Connective tissue disorder work-up for ANA and rheumatoid factor was negative. -Patient currently a gestational age of [redacted] weeks, started on Lovenox 40 mg daily (prophylactic dose).  She had an uneventful first pregnancy during 2001.  No prior history of miscarriages.  Her EDD is 11/19/2017. - Low free protein S of 28% is predicted to for an increased risk of VTE, even though protein S levels are decreased during pregnancy. - Woman with low protein S and prior history of VTE are at increased risk of pregnancy related thrombosis and should receive antepartum and postpartum anticoagulation.   -I agree with the recommendation of anticoagulation therapy with prophylactic low molecular weight heparin for prevention of maternal VTE.  I would recommend intermediate dose anticoagulation with Lovenox starting out with 40 mg subcu once daily, increase as pregnancy progresses to 1 mg/kg dose once daily. -Lovenox was increased to 100 mg daily on 09/24/2017.  She is tolerating it very well.  No bleeding was reported. -Dr. Mora Appl has given a prescription for heparin to be taken twice daily.  She has not filled the prescription yet.  Expected date of delivery is 11/19/2017. -She was told to start back on  Lovenox 40 mg daily postpartum.  We will see her back in 4 weeks for follow-up.

## 2017-12-04 ENCOUNTER — Other Ambulatory Visit (HOSPITAL_COMMUNITY): Payer: Self-pay | Admitting: *Deleted

## 2017-12-04 DIAGNOSIS — I2699 Other pulmonary embolism without acute cor pulmonale: Secondary | ICD-10-CM

## 2017-12-05 ENCOUNTER — Inpatient Hospital Stay (HOSPITAL_COMMUNITY): Payer: Medicaid Other

## 2017-12-05 ENCOUNTER — Inpatient Hospital Stay (HOSPITAL_COMMUNITY): Payer: Medicaid Other | Attending: Hematology | Admitting: Hematology

## 2017-12-05 ENCOUNTER — Encounter (HOSPITAL_COMMUNITY): Payer: Self-pay | Admitting: Hematology

## 2017-12-05 ENCOUNTER — Other Ambulatory Visit: Payer: Self-pay

## 2017-12-05 VITALS — BP 132/88 | HR 80 | Resp 18 | Wt 236.5 lb

## 2017-12-05 DIAGNOSIS — I2699 Other pulmonary embolism without acute cor pulmonale: Secondary | ICD-10-CM

## 2017-12-05 DIAGNOSIS — Z7901 Long term (current) use of anticoagulants: Secondary | ICD-10-CM | POA: Diagnosis not present

## 2017-12-05 DIAGNOSIS — Z86711 Personal history of pulmonary embolism: Secondary | ICD-10-CM | POA: Diagnosis not present

## 2017-12-05 DIAGNOSIS — D6859 Other primary thrombophilia: Secondary | ICD-10-CM

## 2017-12-05 DIAGNOSIS — O99119 Other diseases of the blood and blood-forming organs and certain disorders involving the immune mechanism complicating pregnancy, unspecified trimester: Secondary | ICD-10-CM | POA: Diagnosis not present

## 2017-12-05 DIAGNOSIS — F17210 Nicotine dependence, cigarettes, uncomplicated: Secondary | ICD-10-CM | POA: Diagnosis not present

## 2017-12-05 LAB — APTT: APTT: 27 s (ref 24–36)

## 2017-12-05 LAB — COMPREHENSIVE METABOLIC PANEL
ALBUMIN: 3.6 g/dL (ref 3.5–5.0)
ALK PHOS: 59 U/L (ref 38–126)
ALT: 24 U/L (ref 0–44)
ANION GAP: 9 (ref 5–15)
AST: 23 U/L (ref 15–41)
BUN: 9 mg/dL (ref 6–20)
CHLORIDE: 106 mmol/L (ref 98–111)
CO2: 23 mmol/L (ref 22–32)
Calcium: 9 mg/dL (ref 8.9–10.3)
Creatinine, Ser: 0.86 mg/dL (ref 0.44–1.00)
GFR calc non Af Amer: >60 mL/min (ref >60)
Glucose, Bld: 103 mg/dL — ABNORMAL HIGH (ref 70–99)
Potassium: 4 mmol/L (ref 3.5–5.1)
SODIUM: 138 mmol/L (ref 135–145)
Total Bilirubin: 0.6 mg/dL (ref 0.3–1.2)
Total Protein: 7.7 g/dL (ref 6.5–8.1)

## 2017-12-05 LAB — PROTIME-INR
INR: 0.99
Prothrombin Time: 13 seconds (ref 11.4–15.2)

## 2017-12-05 LAB — CBC WITH DIFFERENTIAL/PLATELET
Abs Immature Granulocytes: 0.02 10*3/uL (ref 0.00–0.07)
BASOS ABS: 0 10*3/uL (ref 0.0–0.1)
BASOS PCT: 1 %
Eosinophils Absolute: 0.2 10*3/uL (ref 0.0–0.5)
Eosinophils Relative: 3 %
HCT: 39.2 % (ref 36.0–46.0)
HEMOGLOBIN: 11.9 g/dL — AB (ref 12.0–15.0)
Immature Granulocytes: 0 %
LYMPHS PCT: 25 %
Lymphs Abs: 1.6 10*3/uL (ref 0.7–4.0)
MCH: 26.7 pg (ref 26.0–34.0)
MCHC: 30.4 g/dL (ref 30.0–36.0)
MCV: 88.1 fL (ref 80.0–100.0)
MONO ABS: 0.4 10*3/uL (ref 0.1–1.0)
Monocytes Relative: 7 %
NRBC: 0 % (ref 0.0–0.2)
Neutro Abs: 4.2 10*3/uL (ref 1.7–7.7)
Neutrophils Relative %: 64 %
Platelets: 312 10*3/uL (ref 150–400)
RBC: 4.45 MIL/uL (ref 3.87–5.11)
RDW: 13.8 % (ref 11.5–15.5)
WBC: 6.4 10*3/uL (ref 4.0–10.5)

## 2017-12-05 NOTE — Progress Notes (Signed)
[facility] [address], [place] [address]   CLINIC:  Medical Oncology/Hematology  PCP:  Patient, No Pcp Per No address on file None   REASON FOR VISIT: Follow-up for a history of pulmonary embolism and low protein S in a pregnant women  CURRENT THERAPY: Lovenox injections 40 mg daily she will continue for 12 weeks postpartum    INTERVAL HISTORY:  Ms. Beth Smith 37 y.o. female returns for routine follow-up for a history of pulmonary embolism and low S in pregnancy. She is 2 week postpartum and doing well she is still on the lovenox and doing well with them. She does report swelling and pain in her right wrist that has been going on for a month now. It has not worsened or improved. She denies any SOB or chest pains. Denies any headaches. Denies any bleeding or dark stools. Denies any easy bruising. She reports her appetite at 100% and her energy at 25%. She has lost 15 pound since giving birth.    REVIEW OF SYSTEMS:  Review of Systems  Musculoskeletal:       Right wrist swelling and pain   All other systems reviewed and are negative.    PAST MEDICAL/SURGICAL HISTORY:  Past Medical History:  Diagnosis Date  . DVT complicating pregnancy, first trimester   . Pulmonary embolism (HCC)    History reviewed. No pertinent surgical history.   SOCIAL HISTORY:  Social History   Socioeconomic History  . Marital status: Single    Spouse name: Not on file  . Number of children: 1  . Years of education: Not on file  . Highest education level: Not on file  Occupational History  . Occupation: Nutritional therapist: [employer]  Social Needs  . Financial resource strain: Not on file  . Food insecurity:    Worry: Not on file    Inability: Not on file  . Transportation needs:    Medical: Not on file    Non-medical: Not on file  Tobacco Use  . Smoking status: Current Every Day Smoker    Packs/day: 0.25    Years: 18.00    Pack years: 4.50    Types:  Cigarettes  . Smokeless tobacco: Never Used  Substance and Sexual Activity  . Alcohol use: Never    Frequency: Never  . Drug use: Never  . Sexual activity: Not on file  Lifestyle  . Physical activity:    Days per week: Not on file    Minutes per session: Not on file  . Stress: Not on file  Relationships  . Social connections:    Talks on phone: Not on file    Gets together: Not on file    Attends religious service: Not on file    Active member of club or organization: Not on file    Attends meetings of clubs or organizations: Not on file    Relationship status: Not on file  . Intimate partner violence:    Fear of current or ex partner: Not on file    Emotionally abused: Not on file    Physically abused: Not on file    Forced sexual activity: Not on file  Other Topics Concern  . Not on file  Social History Narrative  . Not on file    FAMILY HISTORY:  Family History  Problem Relation Age of Onset  . Stroke Mother   . Diabetes Maternal Grandmother     CURRENT MEDICATIONS:  Outpatient  Encounter Medications as of 12/05/2017  Medication Sig  . enoxaparin (LOVENOX) 100 MG/ML injection Inject 1 mL (100 mg total) into the skin daily. (Patient taking differently: Inject 40 mg into the skin daily. )  . Prenatal MV & Min w/FA-DHA ([employer] PRENATAL GUMMY PO) Take by mouth.  . [DISCONTINUED] heparin 96045 UNIT/ML injection INJECT 1 ML (CC) EVERY 12 HOURS   No facility-administered encounter medications on file as of 12/05/2017.     ALLERGIES:  Not on File   PHYSICAL EXAM:  ECOG Performance status: 1  Vitals:   12/05/17 1525  BP: 132/88  Pulse: 80  Resp: 18  SpO2: 99%   Filed Weights   12/05/17 1525  Weight: 236 lb 8 oz (107.3 kg)    Physical Exam  Constitutional: She is oriented to person, place, and time. She appears well-developed and well-nourished.  Musculoskeletal: Normal range of motion.  Neurological: She is alert and oriented to person, place, and time.    Skin: Skin is warm and dry.  Psychiatric: She has a normal mood and affect. Her behavior is normal. Judgment and thought content normal.  Area of her right wrist is slightly swollen.  It is nonerythematous.  It slightly tender to palpation.  No palpable thrombosed veins.   LABORATORY DATA:  I have reviewed the labs as listed.  CBC    Component Value Date/Time   WBC 6.4 12/05/2017 1455   RBC 4.45 12/05/2017 1455   HGB 11.9 (L) 12/05/2017 1455   HCT 39.2 12/05/2017 1455   PLT 312 12/05/2017 1455   MCV 88.1 12/05/2017 1455   MCH 26.7 12/05/2017 1455   MCHC 30.4 12/05/2017 1455   RDW 13.8 12/05/2017 1455   LYMPHSABS 1.6 12/05/2017 1455   MONOABS 0.4 12/05/2017 1455   EOSABS 0.2 12/05/2017 1455   BASOSABS 0.0 12/05/2017 1455   CMP Latest Ref Rng & Units 12/05/2017 11/01/2017  Glucose 70 - 99 mg/dL 409(W) 93  BUN 6 - 20 mg/dL 9 6  Creatinine 1.19 - 1.00 mg/dL 1.47 8.29  Sodium 562 - 145 mmol/L 138 135  Potassium 3.5 - 5.1 mmol/L 4.0 3.9  Chloride 98 - 111 mmol/L 106 106  CO2 22 - 32 mmol/L 23 22  Calcium 8.9 - 10.3 mg/dL 9.0 1.3(Y)  Total Protein 6.5 - 8.1 g/dL 7.7 7.2  Total Bilirubin 0.3 - 1.2 mg/dL 0.6 0.4  Alkaline Phos 38 - 126 U/L 59 75  AST 15 - 41 U/L 23 14(L)  ALT 0 - 44 U/L 24 14         ASSESSMENT & PLAN:   Protein S deficiency complicating pregnancy (HCC) 1.  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 - Family history: Mother died of thromboembolic stroke, maternal aunt with DVT x2, maternal cousin with history of thromboembolism - Superficial thrombophlebitis in the right upper extremity on 04/07/2017 at approximately 10 weeks of pregnancy(current) diagnosed in [facility] - Extensive work-up by Dr.Ribakove at [facility] with negative lupus anticoagulant, anticardiolipin antibody, anti-beta-2 glycoprotein 1 antibody, normal homocysteine, Jak 2 V617F,MPL,CALR-negative,  protein C antigen 108%, protein S antigen 72%, protein S free 28%.  Flow cytometry for PNH panel was negative.  Connective tissue disorder work-up for ANA and rheumatoid factor was negative. -Patient currently a gestational age of [redacted] weeks, started on Lovenox 40 mg daily (prophylactic dose).  She had an uneventful first pregnancy during 2001.  No prior history of miscarriages.  Her  EDD is 11/19/2017. - Low free protein S of 28% is predicted to for an increased risk of VTE, even though protein S levels are decreased during pregnancy. - Woman with low protein S and prior history of VTE are at increased risk of pregnancy related thrombosis and should receive antepartum and postpartum anticoagulation.   -I have recommended anticoagulation with prophylactic low molecular weight heparin for prevention of VTE.  I have recommended intermediate dose anticoagulation with Lovenox starting at 40 mg subcu once daily, increasing as pregnancy progresses to 1 mg/kg once a day.  She was increased to a total of 100 mg during late pregnancy.   - She gave birth to her baby daughter on 11/18/2017.  She was started on Lovenox 40 mg daily. - She has noticed swelling about her right wrist 2 weeks prior to her date of delivery.  She also has pain and soreness in the area.  It hurts when she moves her first metacarpophalangeal joint.  I did not feel any thrombosed veins.  I have recommended doing an ultrasound to rule out any superficial/deep vein thrombosis. - Hoping that she does not have any venous thrombosis, she will continue Lovenox 40 mg daily for 12 weeks, approximately till mid January 2020.  I will see her back in 6 months for follow-up.      Orders placed this encounter:  Orders Placed This Encounter  Procedures  . US Venous Img Upper Uni Right  . CBC with Differential/Platelet  . Comprehensive metabolic panel  . Protime-INR  . APTT      Doreatha Massed, MD [facility] [phone]

## 2017-12-05 NOTE — Assessment & Plan Note (Addendum)
1.  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 - Family history: Mother died of thromboembolic stroke, maternal aunt with DVT x2, maternal cousin with history of thromboembolism - Superficial thrombophlebitis in the right upper extremity on 04/07/2017 at approximately 10 weeks of pregnancy(current) diagnosed in [facility] - Extensive work-up by Dr.Ribakove at [facility] with negative lupus anticoagulant, anticardiolipin antibody, anti-beta-2 glycoprotein 1 antibody, normal homocysteine, Jak 2 V617F,MPL,CALR-negative, protein C antigen 108%, protein S antigen 72%, protein S free 28%.  Flow cytometry for PNH panel was negative.  Connective tissue disorder work-up for ANA and rheumatoid factor was negative. -Patient currently a gestational age of [redacted] weeks, started on Lovenox 40 mg daily (prophylactic dose).  She had an uneventful first pregnancy during 2001.  No prior history of miscarriages.  Her EDD is 11/19/2017. - Low free protein S of 28% is predicted to for an increased risk of VTE, even though protein S levels are decreased during pregnancy. - Woman with low protein S and prior history of VTE are at increased risk of pregnancy related thrombosis and should receive antepartum and postpartum anticoagulation.   -I have recommended anticoagulation with prophylactic low molecular weight heparin for prevention of VTE.  I have recommended intermediate dose anticoagulation with Lovenox starting at 40 mg subcu once daily, increasing as pregnancy progresses to 1 mg/kg once a day.  She was increased to a total of 100 mg during late pregnancy.   - She gave birth to her baby daughter on 11/18/2017.  She was started on Lovenox 40 mg daily. - She has noticed swelling about her right wrist 2 weeks prior to her date of delivery.  She also has pain and soreness in the area.  It hurts when she moves her first metacarpophalangeal  joint.  I did not feel any thrombosed veins.  I have recommended doing an ultrasound to rule out any superficial/deep vein thrombosis. - Hoping that she does not have any venous thrombosis, she will continue Lovenox 40 mg daily for 12 weeks, approximately till mid January 2020.  I will see her back in 6 months for follow-up.

## 2017-12-05 NOTE — Patient Instructions (Signed)
[facility] at [facility] Discharge Instructions  Follow up in 6 months with labs    Thank you for choosing [facility] at [facility] to provide your oncology and hematology care.  To afford each patient quality time with our provider, please arrive at least 15 minutes before your scheduled appointment time.   If you have a lab appointment with the Cancer Center please come in thru the  Main Entrance and check in at the main information desk  You need to re-schedule your appointment should you arrive 10 or more minutes late.  We strive to give you quality time with our providers, and arriving late affects you and other patients whose appointments are after yours.  Also, if you no show three or more times for appointments you may be dismissed from the clinic at the providers discretion.     Again, thank you for choosing [facility].  Our hope is that these requests will decrease the amount of time that you wait before being seen by our physicians.       _____________________________________________________________  Should you have questions after your visit to  Cancer Center, please contact our office at [phone] between the hours of 8:00 a.m. and 4:30 p.m.  Voicemails left after 4:00 p.m. will not be returned until the following business day.  For prescription refill requests, have your pharmacy contact our office and allow 72 hours.    Cancer Center Support Programs:   > Cancer Support Group  2nd Tuesday of the month 1pm-2pm, Journey Room    

## 2017-12-12 ENCOUNTER — Ambulatory Visit (HOSPITAL_COMMUNITY)
Admission: RE | Admit: 2017-12-12 | Discharge: 2017-12-12 | Disposition: A | Discharge status: Home / Self Care | Payer: Medicaid Other | Source: Ambulatory Visit | Attending: Nurse Practitioner | Admitting: Nurse Practitioner

## 2017-12-12 DIAGNOSIS — M7989 Other specified soft tissue disorders: Secondary | ICD-10-CM | POA: Diagnosis not present

## 2017-12-12 DIAGNOSIS — O99119 Other diseases of the blood and blood-forming organs and certain disorders involving the immune mechanism complicating pregnancy, unspecified trimester: Secondary | ICD-10-CM | POA: Insufficient documentation

## 2017-12-12 DIAGNOSIS — Z86711 Personal history of pulmonary embolism: Secondary | ICD-10-CM | POA: Diagnosis not present

## 2017-12-12 DIAGNOSIS — D6859 Other primary thrombophilia: Secondary | ICD-10-CM | POA: Insufficient documentation

## 2017-12-12 DIAGNOSIS — I2699 Other pulmonary embolism without acute cor pulmonale: Secondary | ICD-10-CM

## 2017-12-12 DIAGNOSIS — Z3A00 Weeks of gestation of pregnancy not specified: Secondary | ICD-10-CM | POA: Diagnosis not present

## 2017-12-12 DIAGNOSIS — O26899 Other specified pregnancy related conditions, unspecified trimester: Secondary | ICD-10-CM | POA: Diagnosis not present

## 2018-05-29 ENCOUNTER — Inpatient Hospital Stay (HOSPITAL_COMMUNITY): Payer: Medicaid Other

## 2018-05-31 ENCOUNTER — Other Ambulatory Visit (HOSPITAL_COMMUNITY): Payer: Medicaid Other

## 2018-06-05 ENCOUNTER — Ambulatory Visit (HOSPITAL_COMMUNITY): Payer: Medicaid Other | Admitting: Hematology

## 2018-06-06 ENCOUNTER — Other Ambulatory Visit: Payer: Self-pay

## 2018-06-06 ENCOUNTER — Inpatient Hospital Stay (HOSPITAL_COMMUNITY): Payer: Medicaid Other | Attending: Hematology

## 2018-06-06 ENCOUNTER — Inpatient Hospital Stay (HOSPITAL_BASED_OUTPATIENT_CLINIC_OR_DEPARTMENT_OTHER): Payer: Medicaid Other | Admitting: Nurse Practitioner

## 2018-06-06 DIAGNOSIS — O99119 Other diseases of the blood and blood-forming organs and certain disorders involving the immune mechanism complicating pregnancy, unspecified trimester: Principal | ICD-10-CM

## 2018-06-06 DIAGNOSIS — F17210 Nicotine dependence, cigarettes, uncomplicated: Secondary | ICD-10-CM | POA: Insufficient documentation

## 2018-06-06 DIAGNOSIS — Z86711 Personal history of pulmonary embolism: Secondary | ICD-10-CM | POA: Insufficient documentation

## 2018-06-06 DIAGNOSIS — Z7982 Long term (current) use of aspirin: Secondary | ICD-10-CM | POA: Diagnosis not present

## 2018-06-06 DIAGNOSIS — D6859 Other primary thrombophilia: Secondary | ICD-10-CM

## 2018-06-06 DIAGNOSIS — Z79899 Other long term (current) drug therapy: Secondary | ICD-10-CM | POA: Insufficient documentation

## 2018-06-06 DIAGNOSIS — Z86718 Personal history of other venous thrombosis and embolism: Secondary | ICD-10-CM | POA: Diagnosis not present

## 2018-06-06 DIAGNOSIS — I2699 Other pulmonary embolism without acute cor pulmonale: Secondary | ICD-10-CM

## 2018-06-06 LAB — CBC WITH DIFFERENTIAL/PLATELET
Abs Immature Granulocytes: 0.04 10*3/uL (ref 0.00–0.07)
Basophils Absolute: 0 10*3/uL (ref 0.0–0.1)
Basophils Relative: 0 %
Eosinophils Absolute: 0.3 10*3/uL (ref 0.0–0.5)
Eosinophils Relative: 3 %
HCT: 41.6 % (ref 36.0–46.0)
Hemoglobin: 13.4 g/dL (ref 12.0–15.0)
Immature Granulocytes: 0 %
Lymphocytes Relative: 21 %
Lymphs Abs: 2.1 10*3/uL (ref 0.7–4.0)
MCH: 28.3 pg (ref 26.0–34.0)
MCHC: 32.2 g/dL (ref 30.0–36.0)
MCV: 87.8 fL (ref 80.0–100.0)
Monocytes Absolute: 0.7 10*3/uL (ref 0.1–1.0)
Monocytes Relative: 7 %
Neutro Abs: 6.9 10*3/uL (ref 1.7–7.7)
Neutrophils Relative %: 69 %
Platelets: 267 10*3/uL (ref 150–400)
RBC: 4.74 MIL/uL (ref 3.87–5.11)
RDW: 14.5 % (ref 11.5–15.5)
WBC: 10.1 10*3/uL (ref 4.0–10.5)
nRBC: 0 % (ref 0.0–0.2)

## 2018-06-06 LAB — PROTIME-INR
INR: 1 (ref 0.8–1.2)
Prothrombin Time: 13.1 seconds (ref 11.4–15.2)

## 2018-06-06 LAB — COMPREHENSIVE METABOLIC PANEL
ALT: 16 U/L (ref 0–44)
AST: 18 U/L (ref 15–41)
Albumin: 3.8 g/dL (ref 3.5–5.0)
Alkaline Phosphatase: 60 U/L (ref 38–126)
Anion gap: 9 (ref 5–15)
BUN: 11 mg/dL (ref 6–20)
CO2: 22 mmol/L (ref 22–32)
Calcium: 9.2 mg/dL (ref 8.9–10.3)
Chloride: 104 mmol/L (ref 98–111)
Creatinine, Ser: 0.78 mg/dL (ref 0.44–1.00)
GFR calc Af Amer: >60 mL/min (ref >60)
GFR calc non Af Amer: >60 mL/min (ref >60)
Glucose, Bld: 113 mg/dL — ABNORMAL HIGH (ref 70–99)
Potassium: 3.7 mmol/L (ref 3.5–5.1)
Sodium: 135 mmol/L (ref 135–145)
Total Bilirubin: 0.3 mg/dL (ref 0.3–1.2)
Total Protein: 7.7 g/dL (ref 6.5–8.1)

## 2018-06-06 LAB — D-DIMER, QUANTITATIVE: D-Dimer, Quant: 0.33 ug/mL-FEU (ref 0.00–0.50)

## 2018-06-06 LAB — APTT: aPTT: 25 seconds (ref 24–36)

## 2018-06-06 NOTE — Progress Notes (Signed)
[facility] [address] [place], [place] [id number]   CLINIC:  Medical Oncology/Hematology  PCP:  Patient, No Pcp Per No address on file None   REASON FOR VISIT: Follow-up for pulmonary embolism  CURRENT THERAPY: Observation   INTERVAL HISTORY:  Ms. Beth Smith 38 y.o. female returns for routine follow-up for pulmonary embolism.  She reports she stopped taking her Lovenox mid-January.  She was in the ER one time, she believes in March for right calf pain.  The ER physician placed her on a baby aspirin daily.  She was negative for DVT.  She had no problems since.  Denies any nausea, vomiting, or diarrhea. Denies any new pains. Had not noticed any recent bleeding such as epistaxis, hematuria or hematochezia. Denies recent chest pain on exertion, shortness of breath on minimal exertion, pre-syncopal episodes, or palpitations. Denies any numbness or tingling in hands or feet. Denies any recent fevers, infections, or recent hospitalizations. Patient reports appetite at 100% and energy level at 50%.  She is eating well and maintaining her weight at this time.    REVIEW OF SYSTEMS:  Review of Systems  All other systems reviewed and are negative.    PAST MEDICAL/SURGICAL HISTORY:  Past Medical History:  Diagnosis Date  . DVT complicating pregnancy, first trimester   . Pulmonary embolism (HCC)    No past surgical history on file.   SOCIAL HISTORY:  Social History   Socioeconomic History  . Marital status: Single    Spouse name: Not on file  . Number of children: 1  . Years of education: Not on file  . Highest education level: Not on file  Occupational History  . Occupation: Nutritional therapistsales floor associate    Employer: [employer]  Social Needs  . Financial resource strain: Not on file  . Food insecurity:    Worry: Not on file    Inability: Not on file  . Transportation needs:    Medical: Not on file    Non-medical: Not on file  Tobacco Use  . Smoking status: Current Every  Day Smoker    Packs/day: 0.25    Years: 18.00    Pack years: 4.50    Types: Cigarettes  . Smokeless tobacco: Never Used  Substance and Sexual Activity  . Alcohol use: Never    Frequency: Never  . Drug use: Never  . Sexual activity: Not on file  Lifestyle  . Physical activity:    Days per week: Not on file    Minutes per session: Not on file  . Stress: Not on file  Relationships  . Social connections:    Talks on phone: Not on file    Gets together: Not on file    Attends religious service: Not on file    Active member of club or organization: Not on file    Attends meetings of clubs or organizations: Not on file    Relationship status: Not on file  . Intimate partner violence:    Fear of current or ex partner: Not on file    Emotionally abused: Not on file    Physically abused: Not on file    Forced sexual activity: Not on file  Other Topics Concern  . Not on file  Social History Narrative  . Not on file    FAMILY HISTORY:  Family History  Problem Relation Age of Onset  . Stroke Mother   . Diabetes Maternal Grandmother     CURRENT MEDICATIONS:  Outpatient Encounter Medications  as of 06/06/2018  Medication Sig  . aspirin EC 325 MG tablet Take 325 mg by mouth daily.  Marland Kitchen enoxaparin (LOVENOX) 100 MG/ML injection Inject 1 mL (100 mg total) into the skin daily. (Patient not taking: Reported on 06/06/2018)  . Prenatal MV & Min w/FA-DHA ([employer] PRENATAL GUMMY PO) Take by mouth.   No facility-administered encounter medications on file as of 06/06/2018.     ALLERGIES:  Not on File   PHYSICAL EXAM:  ECOG Performance status: 1  Vitals:   06/06/18 0944  BP: 116/73  Pulse: 81  Resp: 16  Temp: 98.7 F (37.1 C)  SpO2: 98%   Filed Weights   06/06/18 0944  Weight: 249 lb 6.4 oz (113.1 kg)    Physical Exam Constitutional:      Appearance: Normal appearance. She is normal weight.  Cardiovascular:     Rate and Rhythm: Normal rate and regular rhythm.     Heart sounds:  Normal heart sounds.  Pulmonary:     Effort: Pulmonary effort is normal.     Breath sounds: Normal breath sounds.  Abdominal:     General: Bowel sounds are normal.     Palpations: Abdomen is soft.  Musculoskeletal: Normal range of motion.  Skin:    General: Skin is warm and dry.  Neurological:     Mental Status: She is alert and oriented to person, place, and time. Mental status is at baseline.  Psychiatric:        Mood and Affect: Mood normal.        Behavior: Behavior normal.        Thought Content: Thought content normal.        Judgment: Judgment normal.      LABORATORY DATA:  I have reviewed the labs as listed.  CBC    Component Value Date/Time   WBC 10.1 06/06/2018 0849   RBC 4.74 06/06/2018 0849   HGB 13.4 06/06/2018 0849   HCT 41.6 06/06/2018 0849   PLT 267 06/06/2018 0849   MCV 87.8 06/06/2018 0849   MCH 28.3 06/06/2018 0849   MCHC 32.2 06/06/2018 0849   RDW 14.5 06/06/2018 0849   LYMPHSABS 2.1 06/06/2018 0849   MONOABS 0.7 06/06/2018 0849   EOSABS 0.3 06/06/2018 0849   BASOSABS 0.0 06/06/2018 0849   CMP Latest Ref Rng & Units 06/06/2018 12/05/2017 11/01/2017  Glucose 70 - 99 mg/dL 889(V) 694(H) 93  BUN 6 - 20 mg/dL 11 9 6   Creatinine 0.44 - 1.00 mg/dL 0.38 8.82 8.00  Sodium 135 - 145 mmol/L 135 138 135  Potassium 3.5 - 5.1 mmol/L 3.7 4.0 3.9  Chloride 98 - 111 mmol/L 104 106 106  CO2 22 - 32 mmol/L 22 23 22   Calcium 8.9 - 10.3 mg/dL 9.2 9.0 3.4(J)  Total Protein 6.5 - 8.1 g/dL 7.7 7.7 7.2  Total Bilirubin 0.3 - 1.2 mg/dL 0.3 0.6 0.4  Alkaline Phos 38 - 126 U/L 60 59 75  AST 15 - 41 U/L 18 23 14(L)  ALT 0 - 44 U/L 16 24 14      I personally performed a face-to-face visit.  All questions were answered to patient's stated satisfaction. Encouraged patient to call with any new concerns or questions before his next visit to the cancer center and we can certain see him sooner, if needed.     ASSESSMENT & PLAN:   Pulmonary embolism (HCC) 1.  Pulmonary  embolism: - She had an unprovoked pulmonary embolism in 06/2013. -She was treated with  Xarelto for more than a year in [place]. - She was then diagnosed with superficial thrombophlebitis in the right upper extremity on 04/07/2017, she was approximately [redacted] weeks pregnant at this time.  She was diagnosed in [facility]. - She had an extensive work-up by Dr. Angelene Giovanni at [facility] with negative lupus anticoagulant, anticardiolipin antibody, anti-beta-2 glycoprotein 1 antibody, normal homocystine, Jak 2 V6 45F, MPL, CALR-negative, protein C antigen 108%, protein S antigen 72%, protein S free 28%.  Flow cytometry for PNH panel was negative.  Connective tissue disorder work-up for ANA and rheumatoid factor was negative. - Patient started Lovenox injections 40 mg daily at [redacted] weeks gestation.  She was increased to a total of 100 mg during late pregnancy. - She had no prior history of miscarriages.  She had uneventful first pregnancy in 2001. - She gave birth to her daughter on 11/18/2017.  She was started on Lovenox 40 mg daily, which she will continue for 12 weeks, approximately till mid January 2020. - After her delivery she noticed swelling on her right wrist with pain and soreness in the area.  It hurt when she moved her first metacarpophalangeal joint.  We ordered an ultrasound which was negative for any DVTs. - She reports she went to the ER in March with right calf pain.  She reports that ended up being negative for DVT.  The doctor placed her on a baby aspirin daily. - She will follow-up in 6 months with repeat labs.        Orders placed this encounter:  Orders Placed This Encounter  Procedures  . D-dimer, quantitative  . CBC with Differential/Platelet  . Comprehensive metabolic panel  . D-dimer, quantitative  . Protime-INR  . APTT      Mathis Bud, FNP-C [facility] [employer] [phone]

## 2018-06-06 NOTE — Patient Instructions (Signed)
[facility] Discharge Instructions  Follow up in 6 months with labs    Thank you for choosing [facility] to provide your oncology and hematology care.  To afford each patient quality time with our provider, please arrive at least 15 minutes before your scheduled appointment time.   If you have a lab appointment with the Cancer Center please come in thru the  Main Entrance and check in at the main information desk  You need to re-schedule your appointment should you arrive 10 or more minutes late.  We strive to give you quality time with our providers, and arriving late affects you and other patients whose appointments are after yours.  Also, if you no show three or more times for appointments you may be dismissed from the clinic at the providers discretion.     Again, thank you for choosing [facility].  Our hope is that these requests will decrease the amount of time that you wait before being seen by our physicians.       _____________________________________________________________  Should you have questions after your visit to [facility], please contact our office at [phone] between the hours of 8:00 a.m. and 4:30 p.m.  Voicemails left after 4:00 p.m. will not be returned until the following business day.  For prescription refill requests, have your pharmacy contact our office and allow 72 hours.    Cancer Center Support Programs:   > Cancer Support Group  2nd Tuesday of the month 1pm-2pm, Journey Room    

## 2018-06-06 NOTE — Assessment & Plan Note (Addendum)
1.  Pulmonary embolism: - She had an unprovoked pulmonary embolism in 06/2013. -She was treated with Xarelto for more than a year in [place]. - She was then diagnosed with superficial thrombophlebitis in the right upper extremity on 04/07/2017, she was approximately [redacted] weeks pregnant at this time.  She was diagnosed in [facility]. - She had an extensive work-up by Dr. Angelene Giovanni at [facility] with negative lupus anticoagulant, anticardiolipin antibody, anti-beta-2 glycoprotein 1 antibody, normal homocystine, Jak 2 V6 62F, MPL, CALR-negative, protein C antigen 108%, protein S antigen 72%, protein S free 28%.  Flow cytometry for PNH panel was negative.  Connective tissue disorder work-up for ANA and rheumatoid factor was negative. - Patient started Lovenox injections 40 mg daily at [redacted] weeks gestation.  She was increased to a total of 100 mg during late pregnancy. - She had no prior history of miscarriages.  She had uneventful first pregnancy in 2001. - She gave birth to her daughter on 11/18/2017.  She was started on Lovenox 40 mg daily, which she will continue for 12 weeks, approximately till mid January 2020. - After her delivery she noticed swelling on her right wrist with pain and soreness in the area.  It hurt when she moved her first metacarpophalangeal joint.  We ordered an ultrasound which was negative for any DVTs. - She reports she went to the ER in March with right calf pain.  She reports that ended up being negative for DVT.  The doctor placed her on a baby aspirin daily. - She will follow-up in 6 months with repeat labs.

## 2018-12-02 ENCOUNTER — Inpatient Hospital Stay (HOSPITAL_COMMUNITY): Payer: Medicaid Other | Attending: Hematology

## 2018-12-09 ENCOUNTER — Ambulatory Visit (HOSPITAL_COMMUNITY): Payer: Medicaid Other | Admitting: Hematology

## 2020-07-20 ENCOUNTER — Ambulatory Visit: Payer: Self-pay | Admitting: Family Medicine
